# Patient Record
Sex: Male | Born: 1985 | Race: White | Hispanic: No | Marital: Single | Smoking: Never smoker
Health system: Southern US, Community
[De-identification: ages and names within clinical notes are randomized; demographics above are authoritative.]

## PROBLEM LIST (undated history)

## (undated) DIAGNOSIS — J45909 Unspecified asthma, uncomplicated: Secondary | ICD-10-CM

## (undated) DIAGNOSIS — B009 Herpesviral infection, unspecified: Secondary | ICD-10-CM

## (undated) HISTORY — DX: Herpesviral infection, unspecified: B00.9

## (undated) HISTORY — DX: Unspecified asthma, uncomplicated: J45.909

## (undated) HISTORY — PX: APPENDECTOMY: SHX54

---

## 2007-08-06 ENCOUNTER — Emergency Department (HOSPITAL_COMMUNITY): Admission: EM | Admit: 2007-08-06 | Discharge: 2007-08-06 | Payer: Self-pay | Admitting: Emergency Medicine

## 2009-06-08 ENCOUNTER — Emergency Department (HOSPITAL_COMMUNITY): Admission: EM | Admit: 2009-06-08 | Discharge: 2009-06-08 | Payer: Self-pay | Admitting: Emergency Medicine

## 2009-06-20 ENCOUNTER — Emergency Department (HOSPITAL_COMMUNITY): Admission: EM | Admit: 2009-06-20 | Discharge: 2009-06-20 | Payer: Self-pay | Admitting: Emergency Medicine

## 2009-11-04 ENCOUNTER — Emergency Department (HOSPITAL_COMMUNITY): Admission: EM | Admit: 2009-11-04 | Discharge: 2009-11-05 | Payer: Self-pay | Admitting: Emergency Medicine

## 2009-11-07 ENCOUNTER — Emergency Department (HOSPITAL_COMMUNITY): Admission: EM | Admit: 2009-11-07 | Discharge: 2009-11-07 | Payer: Self-pay | Admitting: Emergency Medicine

## 2010-09-27 ENCOUNTER — Emergency Department (HOSPITAL_COMMUNITY): Payer: BC Managed Care – PPO

## 2010-09-27 ENCOUNTER — Emergency Department (HOSPITAL_COMMUNITY)
Admission: EM | Admit: 2010-09-27 | Discharge: 2010-09-27 | Disposition: A | Discharge status: Home / Self Care | Payer: BC Managed Care – PPO | Attending: Emergency Medicine | Admitting: Emergency Medicine

## 2010-09-27 DIAGNOSIS — S0083XA Contusion of other part of head, initial encounter: Secondary | ICD-10-CM | POA: Insufficient documentation

## 2010-09-27 DIAGNOSIS — S0003XA Contusion of scalp, initial encounter: Secondary | ICD-10-CM | POA: Insufficient documentation

## 2010-09-27 DIAGNOSIS — Y92009 Unspecified place in unspecified non-institutional (private) residence as the place of occurrence of the external cause: Secondary | ICD-10-CM | POA: Insufficient documentation

## 2010-09-27 DIAGNOSIS — S60229A Contusion of unspecified hand, initial encounter: Secondary | ICD-10-CM | POA: Insufficient documentation

## 2010-09-27 DIAGNOSIS — H5789 Other specified disorders of eye and adnexa: Secondary | ICD-10-CM | POA: Insufficient documentation

## 2010-09-27 DIAGNOSIS — J0100 Acute maxillary sinusitis, unspecified: Secondary | ICD-10-CM | POA: Insufficient documentation

## 2010-09-27 DIAGNOSIS — J320 Chronic maxillary sinusitis: Secondary | ICD-10-CM | POA: Insufficient documentation

## 2010-09-27 DIAGNOSIS — W19XXXA Unspecified fall, initial encounter: Secondary | ICD-10-CM | POA: Insufficient documentation

## 2011-02-19 LAB — CBC
HCT: 46.5
Hemoglobin: 16.6
MCHC: 35.7
MCV: 91.4
Platelets: 214
RBC: 5.09
RDW: 12.5
WBC: 13.2 — ABNORMAL HIGH

## 2011-02-19 LAB — DIFFERENTIAL
Basophils Absolute: 0
Basophils Relative: 0
Eosinophils Absolute: 0
Eosinophils Relative: 0
Lymphocytes Relative: 2 — ABNORMAL LOW
Lymphs Abs: 0.2 — ABNORMAL LOW
Monocytes Absolute: 0.6
Monocytes Relative: 4
Neutrophils Relative %: 94 — ABNORMAL HIGH

## 2011-02-19 LAB — COMPREHENSIVE METABOLIC PANEL
ALT: 23
Albumin: 4.4
CO2: 24
Calcium: 9.2
Creatinine, Ser: 1.09
GFR calc non Af Amer: >60
Potassium: 4.2
Total Protein: 8

## 2011-02-19 LAB — URINALYSIS, ROUTINE W REFLEX MICROSCOPIC
Protein, ur: NEGATIVE
Specific Gravity, Urine: 1.033 — ABNORMAL HIGH
Urobilinogen, UA: 0.2

## 2011-07-04 ENCOUNTER — Ambulatory Visit (INDEPENDENT_AMBULATORY_CARE_PROVIDER_SITE_OTHER): Payer: BC Managed Care – PPO | Admitting: Physician Assistant

## 2011-07-04 VITALS — BP 130/80 | HR 76 | Temp 98.0°F | Resp 16 | Ht 69.0 in | Wt 168.0 lb

## 2011-07-04 DIAGNOSIS — Z7251 High risk heterosexual behavior: Secondary | ICD-10-CM

## 2011-07-04 DIAGNOSIS — R21 Rash and other nonspecific skin eruption: Secondary | ICD-10-CM

## 2011-07-04 LAB — POCT CBC
Granulocyte percent: 49.2 %G (ref 37–80)
HCT, POC: 43.6 % (ref 43.5–53.7)
Hemoglobin: 14.9 g/dL (ref 14.1–18.1)
Lymph, poc: 2.5 (ref 0.6–3.4)
MCH, POC: 31.6 pg — AB (ref 27–31.2)
MCV: 92.4 fL (ref 80–97)
MID (cbc): 0.6 (ref 0–0.9)
POC Granulocyte: 3 (ref 2–6.9)
POC MID %: 10.4 %M (ref 0–12)
RDW, POC: 12.6 %
WBC: 6.1 10*3/uL (ref 4.6–10.2)

## 2011-07-04 LAB — POCT URINALYSIS DIPSTICK
Bilirubin, UA: NEGATIVE
Blood, UA: NEGATIVE
Nitrite, UA: NEGATIVE
Spec Grav, UA: 1.02

## 2011-07-04 LAB — POCT UA - MICROSCOPIC ONLY
Casts, Ur, LPF, POC: NEGATIVE
Crystals, Ur, HPF, POC: NEGATIVE
Yeast, UA: NEGATIVE

## 2011-07-04 LAB — HIV ANTIBODY (ROUTINE TESTING W REFLEX): HIV: NONREACTIVE

## 2011-07-04 LAB — POCT SKIN KOH: Skin KOH, POC: NEGATIVE

## 2011-07-04 LAB — RPR

## 2011-07-04 LAB — GLUCOSE, POCT (MANUAL RESULT ENTRY): POC Glucose: 112

## 2011-07-04 MED ORDER — VALACYCLOVIR HCL 1 G PO TABS: 1000.0000 mg | ORAL_TABLET | Freq: Two times a day (BID) | ORAL | Status: DC

## 2011-07-04 MED ORDER — CLOTRIMAZOLE-BETAMETHASONE 1-0.05 % EX CREA: TOPICAL_CREAM | Freq: Two times a day (BID) | CUTANEOUS | Status: AC

## 2011-07-04 MED ORDER — DOXYCYCLINE HYCLATE 100 MG PO CAPS: 100.0000 mg | ORAL_CAPSULE | Freq: Two times a day (BID) | ORAL | Status: AC

## 2011-07-04 NOTE — Patient Instructions (Signed)
Safer Sex Your caregiver wants you to have this information about the infections that can be transmitted from sexual contact and how to prevent them. The idea behind safer sex is that you can be sexually active, and at the same time reduce the risk of giving or getting a sexually transmitted disease (STD). Every person should be aware of how to prevent him or herself and his or her sex partner from getting an STD. CAUSES OF STDS STDs are transmitted by sharing body fluids, which contain viruses and bacteria. The following fluids all transmit infections during sexual intercourse and sex acts:  Semen.   Saliva.   Urine.   Blood.   Vaginal mucus.  Examples of STDs include:  Chlamydia.   Gonorrhea.   Genital herpes.   Hepatitis B.   Human immunodeficiency virus or acquired immunodeficiency syndrome (HIV or AIDS).   Syphilis.   Trichomonas.   Pubic lice.   Human papillomavirus (HPV), which may include:   Genital warts.   Cervical dysplasia.   Cervical cancer (can develop with certain types of HPV).  SYMPTOMS  Sexual diseases often cause few or no symptoms until they are advanced, so a person can be infected and spread the infection without knowing it. Some STDs respond to treatment very well. Others, like HIV and herpes, cannot be cured, but are treated to reduce their effects. Specific symptoms include:  Abnormal vaginal discharge.   Irritation or itching in and around the vagina, and in the pubic hair.   Pain during sexual intercourse.   Bleeding during sexual intercourse.   Pelvic or abdominal pain.   Fever.   Growths in and around the vagina.   An ulcer in or around the vagina.   Swollen glands in the groin area.  DIAGNOSIS   Blood tests.   Pap test.   Culture test of abnormal vaginal discharge.   A test that applies a solution and examines the cervix with a lighted magnifying scope (colposcopy).   A test that examines the pelvis with a lighted  tube, through a small incision (laparoscopy).  TREATMENT  The treatment will depend on the cause of the STD.  Antibiotic treatment by injection, oral, creams, or suppositories in the vagina.   Over-the-counter medicated shampoo, to get rid of pubic lice.   Removing or treating growths with medicine, freezing, burning (electrocautery), or surgery.   Surgery treatment for HPV of the cervix.   Supportive medicines for herpes, HIV, AIDS, and hepatitis.  Being careful cannot eliminate all risk of infection, but sex can be made much safer. Safe sexual practices include body massage and gentle touching. Masturbation is safe, as long as body fluids do not contact skin that has sores or cuts. Dry kissing and oral sex on a man wearing a latex condom or on a woman wearing a male condom is also safe. Slightly less safe is intercourse while the man wears a latex condom or wet kissing. It is also safer to have one sex partner that you know is not having sex with anyone else. LENGTH OF ILLNESS An STD might be treated and cured in a week, sometimes a month, or more. And it can linger with symptoms for many years. STDs can also cause damage to the male organs. This can cause chronic pain, infertility, and recurrence of the STD, especially herpes, hepatitis, HIV, and HPV. HOME CARE INSTRUCTIONS AND PREVENTION  Alcohol and recreational drugs are often the reason given for not practicing safer sex. These substances affect   your judgment. Alcohol and recreational drugs can also impair your immune system, making you more vulnerable to disease.   Do not engage in risky and dangerous sexual practices, including:   Vaginal or anal sex without a condom.   Oral sex on a man without a condom.   Oral sex on a woman without a male condom.   Using saliva to lubricate a condom.   Any other sexual contact in which body fluids or blood from one partner contact the other partner.   You should use only latex  condoms for men and water soluble lubricants. Petroleum based lubricants or oils used to lubricate a condom will weaken the condom and increase the chance that it will break.   Think very carefully before having sex with anyone who is high risk for STDs and HIV. This includes IV drug users, people with multiple sexual partners, or people who have had an STD, or a positive hepatitis or HIV blood test.   Remember that even if your partner has had only one previous partner, their previous partner might have had multiple partners. If so, you are at high risk of being exposed to an STD. You and your sex partner should be the only sex partners with each other, with no one else involved.   A vaccine is available for hepatitis B and HPV through your caregiver or the Public Health Department. Everyone should be vaccinated with these vaccines.   Avoid risky sex practices. Sex acts that can break the skin make you more likely to get an STD.  SEEK MEDICAL CARE IF:   If you think you have an STD, even if you do not have any symptoms. Contact your caregiver for evaluation and treatment, if needed.   You think or know your sex partner has acquired an STD.   You have any of the symptoms mentioned above.  Document Released: 06/21/2004 Document Revised: 01/24/2011 Document Reviewed: 04/13/2009 Exit[employer] Patient Information 2012 [employer]. 

## 2011-07-04 NOTE — Progress Notes (Signed)
Subjective:    Patient ID: Jesus Lee, male    DOB: 05/05/1986, 26 y.o.   MRN: [id number]  Primary Physician: No primary provider on file.  Chief Complaint: Rash along groin for 1 week   HPI 26 y.o. y/o Caucasian male with history noted below presents with 1 week history of improving pruritic rash along bilateral inner thighs and penis. Rash began along the right inner thigh, spread to the left thigh, and finally to the penis. Pruritic. No pain or urinary symptoms. Mildly erythematous rash.  Patient mentions having unprotected sex with his ex-girlfriend 3 weeks prior. No other partners. No history of STDs.  4 weeks prior he began taking a multivitamin supplement that he purchased online. Since stopping this vitamin he notes the rash has improved.  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 Patient later states he may have worn dirty under garments.  Has not tried any remedies.  Afebrile. Otherwise doing well.  History reviewed. No pertinent past medical history.  Prior to Admission medications   Medication Sig Start Date End Date Taking? Authorizing Provider  Multiple Vitamin (MULTIVITAMIN) tablet Take 1 tablet by mouth daily.   Yes Historical Provider, MD                         No Known Allergies  History   Social History  . Marital Status: Single    Spouse Name: N/A    Number of Children: N/A  . Years of Education: N/A   Social History Main Topics  . Smoking status: Never Smoker   . Smokeless tobacco: None  . Alcohol Use: None  . Drug Use: None  . Sexually Active: None   Other Topics Concern  . None   Social History Narrative  . None    No family history on file.      Review of Systems  Constitutional: Negative for fever, chills and fatigue.  Gastrointestinal: Negative for nausea, vomiting, abdominal pain and diarrhea.  Genitourinary: Positive for genital sores (see rash comment).  Negative for dysuria, urgency, frequency, hematuria, flank pain, decreased urine volume, discharge, penile swelling, scrotal swelling, enuresis, difficulty urinating, penile pain and testicular pain.  Skin: Positive for rash (Bilateral inner thighs and penis). Negative for wound.       Objective:   Physical Exam  Constitutional: He is oriented to person, place, and time. He appears well-developed and well-nourished. No distress.  HENT:  Head: Normocephalic and atraumatic.  Right Ear: External ear normal.  Left Ear: External ear normal.  Eyes: Conjunctivae and EOM are normal. Pupils are equal, round, and reactive to light. Right eye exhibits no discharge. Left eye exhibits no discharge. No scleral icterus.  Neck: Normal range of motion. Neck supple. No tracheal deviation present. No thyromegaly present.  Cardiovascular: Normal rate, regular rhythm and normal heart sounds.  Exam reveals no gallop and no friction rub.   No murmur heard. Pulmonary/Chest: Effort normal and breath sounds normal. No respiratory distress. He has no wheezes. He has no rales.  Genitourinary: Testes normal.    Cremasteric reflex is present. Circumcised. Penile erythema (see comment ) present. No penile tenderness. No discharge found.  Lymphadenopathy:    He has no cervical adenopathy.       Right: No inguinal adenopathy present.       Left: No inguinal adenopathy present.  Neurological: He is alert and oriented to person, place, and  time.  Skin: Skin is warm and dry. Rash noted. Rash is vesicular (Isolated vesicular lesion along corona without secondary infection). Rash is not urticarial. He is not diaphoretic.     Psychiatric: He has a normal mood and affect. His behavior is normal. Judgment and thought content normal.   Labs: Results for orders placed in visit on 07/04/11  POCT CBC      Component Value Range   WBC 6.1  4.6 - 10.2 (K/uL)   Lymph, poc 2.5  0.6 - 3.4    POC LYMPH PERCENT 40.4  10 - 50 (%L)     MID (cbc) 0.6  0 - 0.9    POC MID % 10.4  0 - 12 (%M)   POC Granulocyte 3.0  2 - 6.9    Granulocyte percent 49.2  37 - 80 (%G)   RBC 4.72  4.69 - 6.13 (M/uL)   Hemoglobin 14.9  14.1 - 18.1 (g/dL)   HCT, POC 56.2  13.0 - 53.7 (%)   MCV 92.4  80 - 97 (fL)   MCH, POC 31.6 (*) 27 - 31.2 (pg)   MCHC 34.2  31.8 - 35.4 (g/dL)   RDW, POC 86.5     Platelet Count, POC 257  142 - 424 (K/uL)   MPV 9.5  0 - 99.8 (fL)  GLUCOSE, POCT (MANUAL RESULT ENTRY)      Component Value Range   POC Glucose 112    POCT URINALYSIS DIPSTICK      Component Value Range   Color, UA yellow     Clarity, UA cloudy     Glucose, UA negative     Bilirubin, UA negative     Ketones, UA negative     Spec Grav, UA 1.020     Blood, UA negative     pH, UA 7.0     Protein, UA negative     Urobilinogen, UA 0.2     Nitrite, UA negative     Leukocytes, UA Negative    POCT UA - MICROSCOPIC ONLY      Component Value Range   WBC, Ur, HPF, POC 0-2     RBC, urine, microscopic negative     Bacteria, U Microscopic small     Mucus, UA negative     Epithelial cells, urine per micros 1-3     Crystals, Ur, HPF, POC negative     Casts, Ur, LPF, POC negative     Yeast, UA negative    POCT SKIN KOH      Component Value Range   Skin KOH, POC Negative     HIV, RPR, HSV 1-2, Uriprobe all pending.       Assessment & Plan:  26 y.o. Caucasian male with pruritic vesicular rash along bilateral thighs and penis. Consider tinea cruris vs HSV vs contact/allergic reaction. 1.  Outpatient Encounter Prescriptions as of 07/04/2011  Medication Sig Dispense Refill        . clotrimazole-betamethasone (LOTRISONE) cream Apply topically 2 (two) times daily.  30 g  0  . doxycycline (VIBRAMYCIN) 100 MG capsule Take 1 capsule (100 mg total) by mouth 2 (two) times daily.  20 capsule  0  . valACYclovir (VALTREX) 1000 MG tablet Take 1 tablet (1,000 mg total) by mouth 2 (two) times daily.  14 tablet  0  2. Stop vitamin 3. Avoid excessive  perspiration 4. Can add benadryl and Zyrtec if needed for pruritis 5. Await labs 6. Avoid intercourse until etiology determined. Advised safe  sex practices. 7. RTC precautions  Signed,  Eula Listen, PA-C 07/04/2011, 11:22 AM

## 2011-07-05 LAB — HSV(HERPES SIMPLEX VRS) I + II AB-IGG
HSV 1 Glycoprotein G Ab, IgG: 1.65 IV — ABNORMAL HIGH
HSV 2 Glycoprotein G Ab, IgG: 0.12 IV

## 2011-07-06 ENCOUNTER — Telehealth: Payer: Self-pay

## 2011-07-06 NOTE — Telephone Encounter (Signed)
LAB RESULTS FROM RECENT VISIT WITH DR. Neva Seat

## 2011-07-07 NOTE — Telephone Encounter (Signed)
Spoke with patient, he already received results from his labs.

## 2011-07-18 ENCOUNTER — Telehealth: Payer: Self-pay

## 2011-07-18 ENCOUNTER — Ambulatory Visit (INDEPENDENT_AMBULATORY_CARE_PROVIDER_SITE_OTHER): Payer: BC Managed Care – PPO | Admitting: Family Medicine

## 2011-07-18 DIAGNOSIS — R21 Rash and other nonspecific skin eruption: Secondary | ICD-10-CM

## 2011-07-18 DIAGNOSIS — B009 Herpesviral infection, unspecified: Secondary | ICD-10-CM

## 2011-07-18 DIAGNOSIS — L738 Other specified follicular disorders: Secondary | ICD-10-CM

## 2011-07-18 DIAGNOSIS — L299 Pruritus, unspecified: Secondary | ICD-10-CM

## 2011-07-18 LAB — POCT CBC
Hemoglobin: 16.2 g/dL (ref 14.1–18.1)
MCHC: 33.5 g/dL (ref 31.8–35.4)
MCV: 93 fL (ref 80–97)
MID (cbc): 0.7 (ref 0–0.9)
POC Granulocyte: 2.9 (ref 2–6.9)
POC MID %: 12.5 %M — AB (ref 0–12)
RDW, POC: 13.1 %

## 2011-07-18 LAB — POCT SEDIMENTATION RATE: POCT SED RATE: 2 mm/hr (ref 0–22)

## 2011-07-18 LAB — POCT SKIN KOH: Skin KOH, POC: NEGATIVE

## 2011-07-18 MED ORDER — TRIAMCINOLONE ACETONIDE 0.1 % EX CREA: TOPICAL_CREAM | CUTANEOUS | Status: AC

## 2011-07-18 NOTE — Telephone Encounter (Signed)
Spoke with patient and gave him names of creams. He understood and i told him to cb with anymore ?'s.

## 2011-07-18 NOTE — Telephone Encounter (Signed)
Pt seen in office today he would like for PA Dunn or nurse to contact him, he is not clear on instructions on his diagnosis and has some other concerns

## 2011-07-18 NOTE — Progress Notes (Signed)
Patient ID: Jesus Lee MRN: [id number], DOB: 09/30/1985, 26 y.o. Date of Encounter: 07/18/2011, 12:24 PM  Primary Physician: No primary provider on file.  Chief Complaint: Follow up rash  HPI: 26 y.o. year old male with history below presents for follow up pruritic rash along bilateral groin, penis, and now along bilateral abdomen. Rash is pruritic along abdomen and groin. Not pruritic along the penis. Afebrile. Skin is flaking/dry. He also mentions mildly erythematous pink tone of skin through out. No pain, blisters, or wounds. Has taken all of the Valtrex, has recently restarted the Doxycycline, and has used the Lotrisone off and on. Has taken Zyrtec and Benadryl to help with the pruritis, but this returns as the medication wanes. He denies any GI or GU symptoms. He continues to take 5 different types of daily vitamins, some of which are bought online. Lives with a roommate who to the patient's knowledge is asymptomatic. He does live with 2 dogs as well. No new soaps or detergents.  He is currently waiting to hear from his ex-girlfriend about her STD work up. No intercourse since last OV.   Past Medical History  Diagnosis Date  . HSV-1 (herpes simplex virus 1) infection      Home Meds: Prior to Admission medications   Medication Sig Start Date End Date Taking? Authorizing Provider  cetirizine (ZYRTEC) 10 MG chewable tablet Chew 10 mg by mouth daily.   Yes Historical Provider, MD  clotrimazole-betamethasone (LOTRISONE) cream Apply topically 2 (two) times daily. 07/04/11 07/03/12 Yes Jaeshawn Silvio, PA-C  doxycycline (DORYX) 100 MG DR capsule Take 100 mg by mouth 2 (two) times daily.   Yes Historical Provider, MD  Multiple Vitamin (MULTIVITAMIN) tablet Take 1 tablet by mouth daily.   Yes Historical Provider, MD  valACYclovir (VALTREX) 1000 MG tablet Take 1 tablet (1,000 mg total) by mouth 2 (two) times daily. 07/04/11 07/03/12 Yes Meshach Perry, PA-C    Allergies: No Known Allergies  History     Social History  . Marital Status: Single    Spouse Name: N/A    Number of Children: N/A  . Years of Education: N/A   Occupational History  . Not on file.   Social History Main Topics  . Smoking status: Never Smoker   . Smokeless tobacco: Not on file  . Alcohol Use: Not on file  . Drug Use: Not on file  . Sexually Active: Not on file   Other Topics Concern  . Not on file   Social History Narrative  . No narrative on file     Review of Systems: Constitutional: negative for chills, fever, night sweats, weight changes, or fatigue  HEENT: negative for vision changes, hearing loss, congestion, rhinorrhea, ST, epistaxis, or sinus pressure Cardiovascular: negative for chest pain or palpitations Respiratory: negative for hemoptysis, wheezing, shortness of breath, or cough Abdominal: negative for abdominal pain, nausea, vomiting, diarrhea, or constipation Genitourinary: negative for dysuria, frequency, urgency, hematuria, discharge, impotence, testicular pain/mass, or nocturia Dermatological: negative for wounds, or lumps Neurologic: negative for headache, dizziness, or syncope All other systems reviewed and are otherwise negative with the exception to those above and in the HPI.   Physical Exam: Blood pressure 130/86, pulse 60, temperature 98.1 F (36.7 C), temperature source Oral, resp. rate 18, height 5\' 9"  (1.753 m), weight 170 lb (77.111 kg)., Body mass index is 25.10 kg/(m^2). General: Well developed, well nourished, in no acute distress. Head: Normocephalic, atraumatic, eyes without discharge, sclera non-icteric, nares are without discharge. Bilateral  auditory canals clear, TM's are without perforation, pearly grey and translucent with reflective cone of light bilaterally. Oral cavity moist, posterior pharynx without exudate, erythema, peritonsillar abscess, or post nasal drip.  Neck: Supple. No thyromegaly. Full ROM. No lymphadenopathy. Lungs: Clear bilaterally to  auscultation without wheezes, rales, or rhonchi. Breathing is unlabored. Heart: RRR with S1 S2. No murmurs, rubs, or gallops appreciated. Abdomen: Soft, non-tender, non-distended with normoactive bowel sounds. No hepatomegaly. No rebound/guarding. No obvious abdominal masses. Msk:  Strength and tone normal for age. Extremities/Skin: Warm and very dry. No clubbing or cyanosis. No edema. Recently shaved tissue along chest, abdomen, and groin. Diffuse mildly erythematous pink non-raised rash through out anterior chest and abdomen with an increase in erythema along LLQ and RLQ with some scaling locally in these areas, thought to be from dry skin rather than infection. Several pinpoint erythematous lesions along bilateral inner thighs left being worse than right. Resolving erythematous lesion along penile shaft. No secondary infections. No linear tracking or excoriations.  Neuro: Alert and oriented X 3. Moves all extremities spontaneously. Gait is normal. CNII-XII grossly in tact. Psych:  Responds to questions appropriately with a normal affect.   Labs: Results for orders placed in visit on 07/18/11  POCT CBC      Component Value Range   WBC 5.8  4.6 - 10.2 (K/uL)   Lymph, poc 2.2  0.6 - 3.4    POC LYMPH PERCENT 37.9  10 - 50 (%L)   MID (cbc) 0.7  0 - 0.9    POC MID % 12.5 (*) 0 - 12 (%M)   POC Granulocyte 2.9  2 - 6.9    Granulocyte percent 49.6  37 - 80 (%G)   RBC 5.20  4.69 - 6.13 (M/uL)   Hemoglobin 16.2  14.1 - 18.1 (g/dL)   HCT, POC 16.1  09.6 - 53.7 (%)   MCV 93.0  80 - 97 (fL)   MCH, POC 31.2  27 - 31.2 (pg)   MCHC 33.5  31.8 - 35.4 (g/dL)   RDW, POC 04.5     Platelet Count, POC 276  142 - 424 (K/uL)   MPV 8.9  0 - 99.8 (fL)  POCT SEDIMENTATION RATE      Component Value Range   POCT SED RATE    0 - 22 (mm/hr)  POCT SKIN KOH      Component Value Range   Skin KOH, POC Negative     CMP/TSH/ESR all pending.  ASSESSMENT AND PLAN:  26 y.o. year old male with likely folliculitis  along inner thighs, and irritation from dry skin/shaving along anterior chest and abdomen. -Triamcinolone topical cream 0.1% Apply to affected area bid #45 grams 2 RF -Eucerin cream bid as his skin is quite dry -Stop all OTC supplements -Hold on shaving the body -Finish Doxycycline that was previously prescribed -Await labs, follow up pending -RTC precautions -Seen and discussed with Dr. Neva Seat  Signed, Eula Listen, PA-C 07/18/2011 12:24 PM

## 2011-07-18 NOTE — Telephone Encounter (Signed)
Pt returning phone call; please call back

## 2011-07-18 NOTE — Patient Instructions (Signed)
Folliculitis      Folliculitis is an infection and inflammation of the hair follicles. Hair follicles become red and irritated. This inflammation is usually caused by bacteria. The bacteria thrive in warm, moist environments. This condition can be seen anywhere on the body.   CAUSES  The most common cause of folliculitis is an infection by germs (bacteria). Fungal and viral infections can also cause the condition. Viral infections may be more common in people whose bodies are unable to fight disease well (weakened immune systems). Examples include people with:  · AIDS.   · An organ transplant.   · Cancer.   People with depressed immune systems, diabetes, or obesity, have a greater risk of getting folliculitis than the general population. Certain chemicals, especially oils and tars, also can cause folliculitis.  SYMPTOMS  · An early sign of folliculitis is a small, white or yellow pus-filled, itchy lesion (pustule). These lesions appear on a red, inflamed follicle. They are usually less than 5 mm (.20 inches).   · The most likely starting points are the scalp, thighs, legs, back and buttocks. Folliculitis is also frequently found in areas of repeated shaving.   · When an infection of the follicle goes deeper, it becomes a boil or furuncle. A group of closely packed boils create a larger lesion (a carbuncle). These sores (lesions) tend to occur in hairy, sweaty areas of the body.   TREATMENT   · A doctor who specializes in skin problems (dermatologists) treats mild cases of folliculitis with antiseptic washes.   · They also use a skin application which kills germs (topical antibiotics). Tea tree oil is a good topical antiseptic as well. It can be found at a health food store. A small percentage of individuals may develop an allergy to the tea tree oil.   · Mild to moderate boils respond well to warm water compresses applied three times daily.   · In some cases, oral antibiotics should be taken with the skin treatment.    · If lesions contain large quantities of pus or fluid, your caregiver may drain them. This allows the topical antibiotics to get to the affected areas better.   · Stubborn cases of folliculitis may respond to laser hair removal. This process uses a high intensity light beam (a laser) to destroy the follicle and reduces the scarring from folliculitis. After laser hair removal, hair will no longer grow in the laser treated area.   Patients with long-lasting folliculitis need to find out where the infection is coming from. Germs can live in the nostrils of the patient. This can trigger an outbreak now and then. Sometimes the bacteria live in the nostrils of a family member. This person does not develop the disorder but they repeatedly re-expose others to the germ. To break the cycle of recurrence in the patient, the family member must also undergo treatment.  PREVENTION   · Individuals who are predisposed to folliculitis should be extremely careful about personal hygiene.   · Application of antiseptic washes may help prevent recurrences.   · A topical antibiotic cream, mupirocin (Bactroban®), has been effective at reducing bacteria in the nostrils. It is applied inside the nose with your little finger. This is done twice daily for a week. Then it is repeated every 6 months.   · Because follicle disorders tend to come back, patients must receive follow-up care. Your caregiver may be able to recognize a recurrence before it becomes severe.   SEEK IMMEDIATE MEDICAL CARE   IF:   · You develop redness, swelling, or increasing pain in the area.   · You have a fever.   · You are not improving with treatment or are getting worse.   · You have any other questions or concerns.   Document Released: 07/23/2001 Document Revised: 01/24/2011 Document Reviewed: 05/19/2008  [employer]® Patient Information ©2012 [employer].

## 2011-07-18 NOTE — Telephone Encounter (Signed)
LMOM to CB. 

## 2011-07-19 LAB — COMPREHENSIVE METABOLIC PANEL
ALT: 19 U/L (ref 0–53)
AST: 19 U/L (ref 0–37)
Albumin: 5 g/dL (ref 3.5–5.2)
BUN: 14 mg/dL (ref 6–23)
CO2: 28 mEq/L (ref 19–32)
Calcium: 9.8 mg/dL (ref 8.4–10.5)
Chloride: 102 mEq/L (ref 96–112)
Creat: 1 mg/dL (ref 0.50–1.35)
Glucose, Bld: 82 mg/dL (ref 70–99)
Total Bilirubin: 0.7 mg/dL (ref 0.3–1.2)

## 2011-07-19 LAB — TSH: TSH: 1.553 u[IU]/mL (ref 0.350–4.500)

## 2011-07-25 NOTE — Progress Notes (Signed)
  Subjective:    Patient ID: Jesus Lee, male    DOB: 1986-02-25, 26 y.o.   MRN: [id number]  HPI    Review of Systems     Objective:   Physical Exam        Assessment & Plan:  Pt seen and examined with Eula Listen, PA-C on 07/18/11. Agree with exam and plan.

## 2011-07-30 ENCOUNTER — Ambulatory Visit (INDEPENDENT_AMBULATORY_CARE_PROVIDER_SITE_OTHER): Payer: BC Managed Care – PPO | Admitting: Physician Assistant

## 2011-07-30 VITALS — BP 132/75 | HR 53 | Temp 97.7°F | Resp 16 | Ht 69.0 in | Wt 168.0 lb

## 2011-07-30 DIAGNOSIS — S0180XA Unspecified open wound of other part of head, initial encounter: Secondary | ICD-10-CM

## 2011-07-30 DIAGNOSIS — A6001 Herpesviral infection of penis: Secondary | ICD-10-CM

## 2011-07-30 DIAGNOSIS — B009 Herpesviral infection, unspecified: Secondary | ICD-10-CM

## 2011-07-30 DIAGNOSIS — Z23 Encounter for immunization: Secondary | ICD-10-CM

## 2011-07-30 DIAGNOSIS — R21 Rash and other nonspecific skin eruption: Secondary | ICD-10-CM

## 2011-07-30 MED ORDER — VALACYCLOVIR HCL 1 G PO TABS: 1000.0000 mg | ORAL_TABLET | Freq: Every day | ORAL | Status: AC

## 2011-07-30 NOTE — Progress Notes (Signed)
Patient ID: Jesus Lee MRN: [id number], DOB: 1986-01-03, 26 y.o. Date of Encounter: 07/30/2011, 10:53 AM  Primary Physician: No primary provider on file.  Chief Complaint: Follow up rash, resolved  HPI: 26 y.o. year old male with history below presents for follow up of folliculitis/skin irritation rash. Seen initially on 2/6 and again on 2/20. Rash along abdomen and inner thighs has fully resolved. Patient states he changed his old bed sheets after his last visit and got new ones. This caused his rash along his abdomen and inner thighs to improve and ultimately resolve. No further pruritis. He has continued to shave his body hair. Using a new razor currently.  The isolated penile lesion has mostly resolved. No dysuria, urinary frequency, or urgency. No discharge. No new partners. 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  He has remained afebrile. No myalgias or arthralgias.  He also states several days ago he leaned over and hit his forehead along a piece of metal at work. It bled a small amount. No pain, swelling, erythema, or tenderness. He requests a tetanus shot today, unsure of when his last one was. No headache or LOC.   Past Medical History  Diagnosis Date  . HSV-1 (herpes simplex virus 1) infection      Home Meds: Prior to Admission medications   Medication Sig Start Date End Date Taking? Authorizing Provider  cetirizine (ZYRTEC) 10 MG chewable tablet Chew 10 mg by mouth daily.   Yes Historical Provider, MD  clotrimazole-betamethasone (LOTRISONE) cream Apply topically 2 (two) times daily. 07/04/11 07/03/12 Yes Rehana Uncapher, PA-C  doxycycline (DORYX) 100 MG DR capsule Take 100 mg by mouth 2 (two) times daily.   Yes Historical Provider, MD  Multiple Vitamin (MULTIVITAMIN) tablet Take 1 tablet by mouth daily.   Yes Historical Provider, MD  triamcinolone cream (KENALOG) 0.1 % APPLY TO AFFECTED AREA BID 07/18/11 07/17/12 Yes Nuha Degner, PA-C  valACYclovir  (VALTREX) 1000 MG tablet Take 1 tablet (1,000 mg total) by mouth 2 (two) times daily. 07/04/11 07/03/12 Yes Mariangel Ringley, PA-C    Allergies: No Known Allergies  History   Social History  . Marital Status: Single    Spouse Name: N/A    Number of Children: N/A  . Years of Education: N/A   Occupational History  . Not on file.   Social History Main Topics  . Smoking status: Never Smoker   . Smokeless tobacco: Not on file  . Alcohol Use: Not on file  . Drug Use: Not on file  . Sexually Active: Not on file   Other Topics Concern  . Not on file   Social History Narrative  . No narrative on file     Review of Systems: Constitutional: negative for chills, fever, night sweats, weight changes, or fatigue  HEENT: negative for vision changes, hearing loss, congestion, rhinorrhea, ST, epistaxis, or sinus pressure Cardiovascular: negative for chest pain or palpitations Respiratory: negative for hemoptysis, wheezing, shortness of breath, or cough Abdominal: negative for abdominal pain, nausea, vomiting, diarrhea, or constipation Dermatological: negative for rash, or erythema Neurologic: negative for headache, dizziness, or syncope All other systems reviewed and are otherwise negative with the exception to those above and in the HPI.   Physical Exam: Blood pressure 132/75, pulse 53, temperature 97.7 F (36.5 C), temperature source Oral, resp. rate 16, height 5\' 9"  (1.753 m), weight 168 lb (76.204 kg)., Body mass index is 24.81 kg/(m^2). General: Well developed, well nourished, in no acute  distress. Head: Normocephalic, atraumatic, eyes without discharge, sclera non-icteric, nares are without discharge. Bilateral auditory canals clear, TM's are without perforation, pearly grey and translucent with reflective cone of light bilaterally. Oral cavity moist, posterior pharynx without exudate, erythema, peritonsillar abscess, or post nasal drip.  Neck: Supple. No thyromegaly. Full ROM. No  lymphadenopathy. Lungs: Clear bilaterally to auscultation without wheezes, rales, or rhonchi. Breathing is unlabored. Heart: RRR with S1 S2. No murmurs, rubs, or gallops appreciated. Abdomen: Soft, non-tender, non-distended with normoactive bowel sounds. No hepatomegaly. No rebound/guarding. No obvious abdominal masses.  Genitourinary: Improved isolated vesicle along head of the penis. No secondary infection. No discharge expressed.  Msk:  Strength and tone normal for age. Extremities/Skin: Warm and dry. No clubbing or cyanosis. No edema. No rashes, erythema, dry skin, scaling, or suspicious lesions. Neuro: Alert and oriented X 3. Moves all extremities spontaneously. Gait is normal. CNII-XII grossly in tact. Psych:  Responds to questions appropriately with a normal affect.     ASSESSMENT AND PLAN:  26 y.o. year old male with resolved folliculitis/skin irritation and healing superficial wound along forehead 1. Folliculitis/skin irritation -Resolved -Continue to use a clean razor if plans to shave -Wash bed clothes regularly  2. Wound forehead -Resolving -TDaP today -No secondary infection  3. Schedule a CPE  Signed, Eula Listen, PA-C 07/30/2011 10:53 AM

## 2011-08-30 ENCOUNTER — Encounter: Payer: Self-pay | Admitting: Internal Medicine

## 2011-08-30 ENCOUNTER — Ambulatory Visit: Payer: BC Managed Care – PPO

## 2011-08-30 ENCOUNTER — Ambulatory Visit (INDEPENDENT_AMBULATORY_CARE_PROVIDER_SITE_OTHER): Payer: BC Managed Care – PPO | Admitting: Family Medicine

## 2011-08-30 VITALS — BP 132/88 | HR 60 | Temp 98.0°F | Resp 16 | Ht 69.0 in | Wt 171.6 lb

## 2011-08-30 DIAGNOSIS — M25519 Pain in unspecified shoulder: Secondary | ICD-10-CM

## 2011-08-30 DIAGNOSIS — IMO0002 Reserved for concepts with insufficient information to code with codable children: Secondary | ICD-10-CM

## 2011-08-30 DIAGNOSIS — M25512 Pain in left shoulder: Secondary | ICD-10-CM

## 2011-08-30 DIAGNOSIS — S43409A Unspecified sprain of unspecified shoulder joint, initial encounter: Secondary | ICD-10-CM

## 2011-08-30 MED ORDER — MELOXICAM 7.5 MG PO TABS: 7.5000 mg | ORAL_TABLET | Freq: Every day | ORAL | Status: DC

## 2011-08-30 NOTE — Progress Notes (Signed)
  Subjective:    Patient ID: Jesus Lee, male    DOB: 1985/12/21, 26 y.o.   MRN: [id number]  Shoulder Pain  The pain is present in the left shoulder. This is a new problem. The current episode started in the past 7 days. There has been a history of trauma. The problem has been unchanged. The quality of the pain is described as aching. Associated symptoms include a limited range of motion. The symptoms are aggravated by activity. He has tried NSAIDS for the symptoms. The treatment provided mild relief.  Jesus Lee is a 26 year old WM who was playing basketball 4 days ago when he felt some pain and discomfort in his left shoulder.  He was trying to steal a ball and his arm was outstretched, he did not fall but he did leave the game after his injury.  He tells me he also injured it 4 years ago playing basketball but was never evaluated.  He has taken ibuprophen with some relief but he works at [employer] and had trouble lifting a 40 pound box today and dropped it.  He denies any numbness or tingling in his arm.    Review of Systems  All other systems reviewed and are negative.       Objective:   Physical Exam  Vitals reviewed. Constitutional: He appears well-nourished.  Cardiovascular: Normal rate, regular rhythm and normal heart sounds.   Pulmonary/Chest: Effort normal and breath sounds normal.  Musculoskeletal: He exhibits tenderness.       Left shoulder with tenderness over his anterior deltoid and over his humeral epicondyle laterally.  He has no tenderness over his scapula or his cervical spine.  FROM of cervical spine.  Positive pain with empty can test.  Pain with extremes ROM overhead.   Jesus Lee reading (PRIMARY) by  Dr. Georgiana Shore.  Negative for fracture or dislocation of left shoulder.         Assessment & Plan:  Left shoulder sprain.  Mobic 7. 5 mg BID for 1-2 weeks.  Ice shoulder every night, he should avoid lifting 40 pound boxes for at least a week to allow his shoulder to rest.  If  not improving will refer to orthopedics, pt agrees.  AVS printed and given.

## 2011-08-30 NOTE — Progress Notes (Signed)
  Subjective:    Patient ID: Jesus Lee, male    DOB: 04-24-1986, 26 y.o.   MRN: [id number]  HPI    Review of Systems     Objective:   Physical Exam        Assessment & Plan:  Case reviewed with PA-C.  Agree with plan.

## 2011-08-30 NOTE — Patient Instructions (Signed)
Shoulder Sprain       A shoulder sprain is the result of damage to the tough, fiber-like tissues (ligaments) that help hold your shoulder in place. The ligaments may be stretched or torn. Besides the main shoulder joint (the ball and socket), there are several smaller joints that connect the bones in this area. A sprain usually involves one of those joints. Most often it is the acromioclavicular (or AC) joint. That is the joint that connects the collarbone (clavicle) and the shoulder blade (scapula) at the top point of the shoulder blade (acromion).   A shoulder sprain is a mild form of what is called a shoulder separation. Recovering from a shoulder sprain may take some time. For some, pain lingers for several months. Most people recover without long term problems.   CAUSES   A shoulder sprain is usually caused by some kind of trauma. This might be:   Falling on an outstretched arm.   Being hit hard on the shoulder.   Twisting the arm.   Shoulder sprains are more likely to occur in people who:   Play sports.   Have balance or coordination problems.  SYMPTOMS   Pain when you move your shoulder.   Limited ability to move the shoulder.   Swelling and tenderness on top of the shoulder.   Redness or warmth in the shoulder.   Bruising.   A change in the shape of the shoulder.  DIAGNOSIS   Your healthcare provider may:   Ask about your symptoms.   Ask about recent activity that might have caused those symptoms.   Examine your shoulder. You may be asked to do simple exercises to test movement. The other shoulder will be examined for comparison.   Order some tests that provide a look inside the body. They can show the extent of the injury. The tests could include:   X-rays.   CT (computed tomography) scan.   MRI (magnetic resonance imaging) scan.  RISKS AND COMPLICATIONS   Loss of full shoulder motion.   Ongoing shoulder pain.  TREATMENT   How long it takes to recover from a shoulder sprain depends on how severe it was.  Treatment options may include:   Rest. You should not use the arm or shoulder until it heals.   Ice. For 2 or 3 days after the injury, put an ice pack on the shoulder up to 4 times a day. It should stay on for 15 to 20 minutes each time. Wrap the ice in a towel so it does not touch your skin.   Over-the-counter medicine to relieve pain.   A sling or brace. This will keep the arm still while the shoulder is healing.   Physical therapy or rehabilitation exercises. These will help you regain strength and motion. Ask your healthcare provider when it is OK to begin these exercises.   Surgery. The need for surgery is rare with a sprained shoulder, but some people may need surgery to keep the joint in place and reduce pain.  HOME CARE INSTRUCTIONS   Ask your healthcare provider about what you should and should not do while your shoulder heals.   Make sure you know how to apply ice to the correct area of your shoulder.   Talk with your healthcare provider about which medications should be used for pain and swelling.   If rehabilitation therapy will be needed, ask your healthcare provider to refer you to a therapist. If it is not recommended, then ask   about at-home exercises. Find out when exercise should begin.  SEEK MEDICAL CARE IF:   Your pain, swelling, or redness at the joint increases.   SEEK IMMEDIATE MEDICAL CARE IF:   You have a fever.   You cannot move your arm or shoulder.  Document Released: 09/30/2008 Document Revised: 05/03/2011 Document Reviewed: 09/30/2008   [employer] Patient Information 2012 [employer].

## 2011-09-17 ENCOUNTER — Encounter: Payer: Self-pay | Admitting: Physician Assistant

## 2011-09-17 ENCOUNTER — Ambulatory Visit (INDEPENDENT_AMBULATORY_CARE_PROVIDER_SITE_OTHER): Payer: BC Managed Care – PPO | Admitting: Physician Assistant

## 2011-09-17 VITALS — BP 150/77 | HR 70 | Temp 98.3°F | Resp 18 | Ht 69.0 in | Wt 169.0 lb

## 2011-09-17 DIAGNOSIS — A6001 Herpesviral infection of penis: Secondary | ICD-10-CM

## 2011-09-17 DIAGNOSIS — L255 Unspecified contact dermatitis due to plants, except food: Secondary | ICD-10-CM

## 2011-09-17 DIAGNOSIS — B009 Herpesviral infection, unspecified: Secondary | ICD-10-CM

## 2011-09-17 DIAGNOSIS — L237 Allergic contact dermatitis due to plants, except food: Secondary | ICD-10-CM

## 2011-09-17 DIAGNOSIS — L299 Pruritus, unspecified: Secondary | ICD-10-CM

## 2011-09-17 DIAGNOSIS — R21 Rash and other nonspecific skin eruption: Secondary | ICD-10-CM

## 2011-09-17 MED ORDER — PREDNISONE 20 MG PO TABS: ORAL_TABLET | ORAL | Status: AC

## 2011-09-17 MED ORDER — VALACYCLOVIR HCL 500 MG PO TABS: 500.0000 mg | ORAL_TABLET | Freq: Two times a day (BID) | ORAL | Status: AC

## 2011-09-17 NOTE — Progress Notes (Signed)
Patient ID: Jesus Lee MRN: [id number], DOB: Oct 06, 1985, 26 y.o. Date of Encounter: 09/17/2011, 5:49 PM  Primary Physician: No primary provider on file.  Chief Complaint: HSV flare up  HPI: 26 y.o. year old male with history below presents with a couple issues. 1) 2-3 days history of HSV flare up along the genitals. Has been taking his suppressive dose of Valtrex daily. 2-3 days prior he noticed a return of his vesicular rash along his penis. No discharge or dysuria. Afebrile.  2) 1 week history of pruritic rash along the top of his right foot. This comes on after he spent some time outside bare foot. Rash is vesicular and previously weeping. Afebrile. No spreading. Slowly beginning to improve.  3) He is also concerned today due to the recent case of measles in the area. He was in the clinic one of the days the source patient was here at [facility]. He has remained afebrile and without URI symptoms. He did receive all of his childhood vaccinations. He would just like to check and verify that he is indeed immune to measles.    Past Medical History  Diagnosis Date  . HSV-1 (herpes simplex virus 1) infection      Home Meds: Prior to Admission medications   Medication Sig Start Date End Date Taking? Authorizing Provider  clotrimazole-betamethasone (LOTRISONE) cream Apply topically 2 (two) times daily. 07/04/11 07/03/12 Yes Annsley Akkerman M Donshay Lupinski, PA-C  Multiple Vitamin (MULTIVITAMIN) tablet Take 1 tablet by mouth daily.   Yes Historical Provider, MD  omega-3 acid ethyl esters (LOVAZA) 1 G capsule Take 2 g by mouth 2 (two) times daily.   Yes Historical Provider, MD  triamcinolone cream (KENALOG) 0.1 % APPLY TO AFFECTED AREA BID 07/18/11 07/17/12 Yes Nayshawn Mesta M Serai Tukes, PA-C  valACYclovir (VALTREX) 1000 MG tablet Take 1 tablet (1,000 mg total) by mouth daily. 07/30/11 07/29/12 Yes Waverly Chavarria M Chima Astorino, PA-C  vitamin B-12 (CYANOCOBALAMIN) 500 MCG tablet Take 500 mcg by mouth daily.   Yes Historical Provider, MD  cetirizine  (ZYRTEC) 10 MG chewable tablet Chew 10 mg by mouth daily.    Historical Provider, MD                  Allergies: No Known Allergies  History   Social History  . Marital Status: Single    Spouse Name: N/A    Number of Children: N/A  . Years of Education: N/A   Occupational History  . Not on file.   Social History Main Topics  . Smoking status: Never Smoker   . Smokeless tobacco: Not on file  . Alcohol Use: Not on file  . Drug Use: Not on file  . Sexually Active: Not on file   Other Topics Concern  . Not on file   Social History Narrative  . No narrative on file     Review of Systems: Constitutional: negative for chills, fever, night sweats, weight changes, or fatigue  HEENT: negative for vision changes, hearing loss, congestion, rhinorrhea, ST, epistaxis, or sinus pressure Cardiovascular: negative for chest pain or palpitations Respiratory: negative for hemoptysis, wheezing, shortness of breath, or cough Abdominal: negative for abdominal pain, nausea, vomiting, diarrhea, or constipation Dermatological: negative for wound or lesion Neurologic: negative for headache, dizziness, or syncope All other systems reviewed and are otherwise negative with the exception to those above and in the HPI.   Physical Exam: Blood pressure 150/77, pulse 70, temperature 98.3 F (36.8 C), temperature source Oral, resp. rate 18, height 5'  9" (1.753 m), weight 169 lb (76.658 kg)., Body mass index is 24.96 kg/(m^2). well appearing General: Well developed, well nourished, in no acute distress. Head: Normocephalic, atraumatic, eyes without discharge, sclera non-icteric, nares are without discharge. Bilateral auditory canals clear, TM's are without perforation, pearly grey and translucent with reflective cone of light bilaterally. Oral cavity moist, posterior pharynx without exudate, erythema, peritonsillar abscess, or post nasal drip.  Neck: Supple. No thyromegaly. Full ROM. No  lymphadenopathy. Lungs: Clear bilaterally to auscultation without wheezes, rales, or rhonchi. Breathing is unlabored. Heart: RRR with S1 S2. No murmurs, rubs, or gallops appreciated. Abdomen: Soft, non-tender, non-distended with normoactive bowel sounds. No hepatomegaly. No rebound/guarding. No obvious abdominal masses. GU: vesicular rash along the head of the penis without signs of secondary infection Msk:  Strength and tone normal for age. Extremities/Skin: Warm and dry. No clubbing or cyanosis. No edema. Resolving pinpoint rash along the dorsum of the right foot. No signs of secondary infection. Neuro: Alert and oriented X 3. Moves all extremities spontaneously. Gait is normal. CNII-XII grossly in tact. Psych:  Responds to questions appropriately with a normal affect.   Labs: MMR titers pending   ASSESSMENT AND PLAN:  26 y.o. year old male with HSV flare, poison ivy, and immunization review 1. HSV flare -Valtrex 500 mg 1 po bid #6 RF 2 -Continue daily Valtrex as directed -Abstain from intercourse  2. Poison ivy -Prednisone 20 mg #18 3x3, 2x3, 1x3 no RF  3. Immunization review -Await MMR titer    Signed, Eula Listen, PA-C 09/17/2011 5:49 PM

## 2011-12-16 IMAGING — CT CT HEAD W/O CM
3 series · 16 of 47 positions shown, 19 images · non-contrast
Comparison: None.

CT HEAD

CLINICAL DATA: Fall with injury to the left orbital region and
upper lip.  Pain.

CT HEAD WITHOUT CONTRAST
CT MAXILLOFACIAL WITHOUT CONTRAST
TECHNIQUE: Multidetector CT imaging of the head and maxillofacial
structures were performed using the standard protocol without
intravenous contrast. Multiplanar CT image reconstructions of the
maxillofacial structures were also generated.

[Series 4: headseq 4.8 h45s · axial · 0.43mm/px · z∈[-116,+26]mm · 10 of 36 slices shown, 13 images]
[im 3/36  brain]
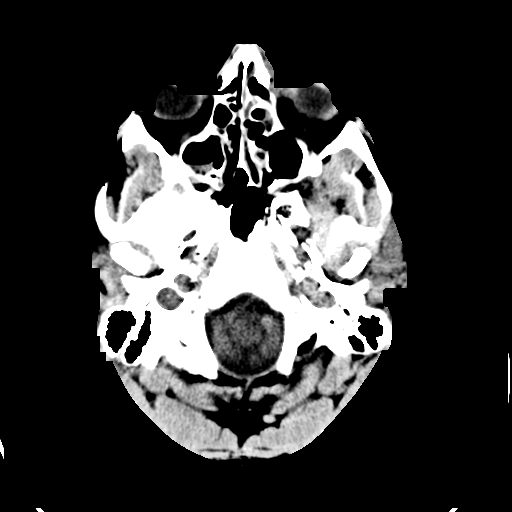
[im 3/36  bone]
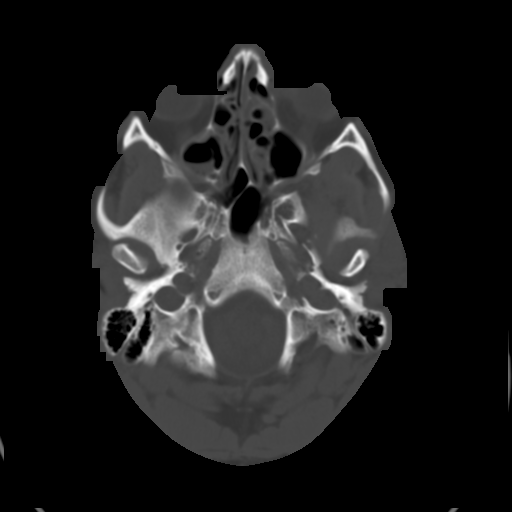
[im 7/36  brain]
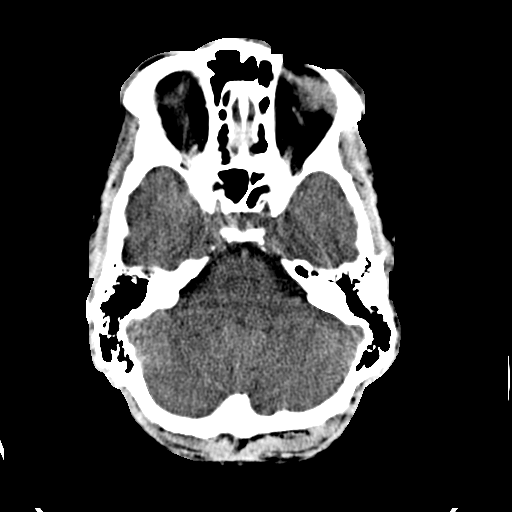
[im 10/36  brain]
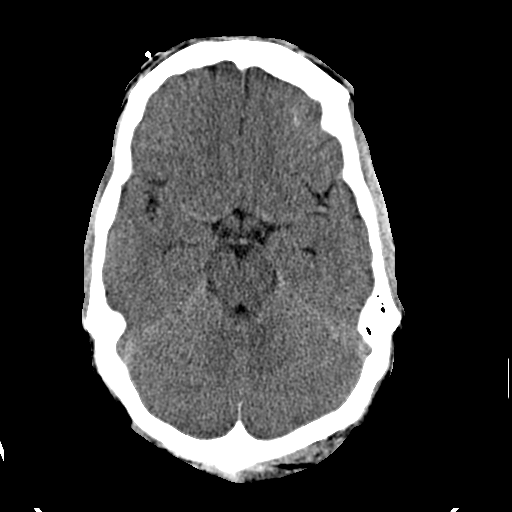
[im 13/36  brain]
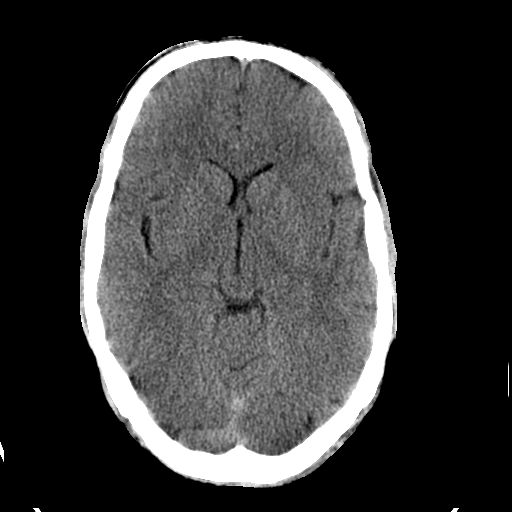
[im 16/36  brain]
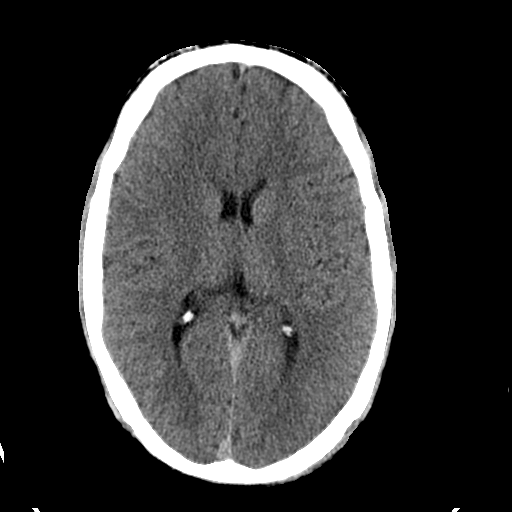
[im 16/36  bone]
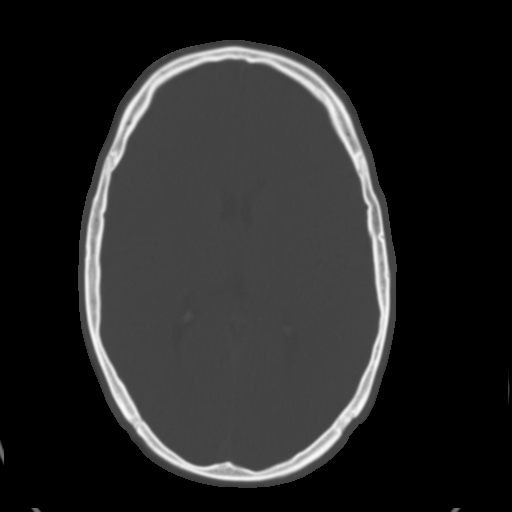
[im 20/36  brain]
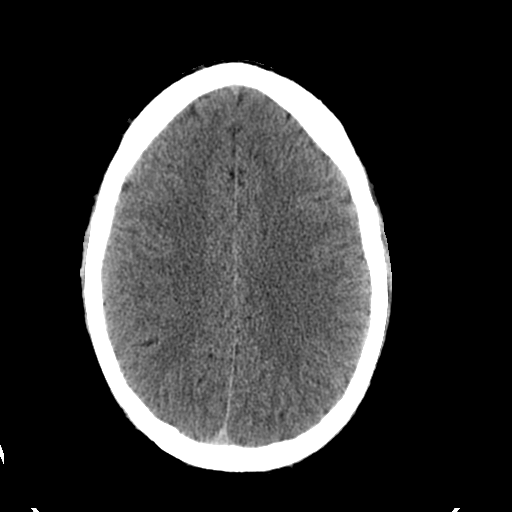
[im 23/36  brain]
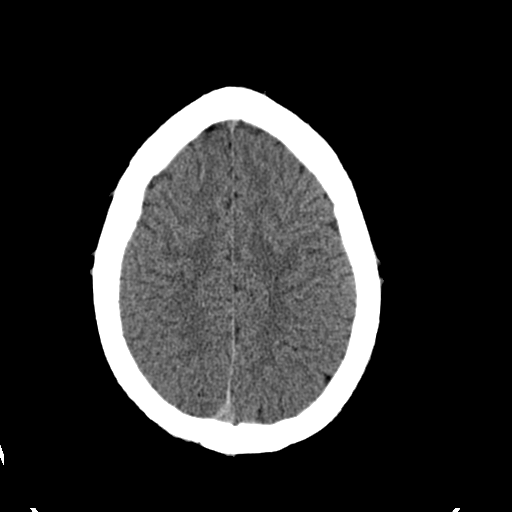
[im 27/36  brain]
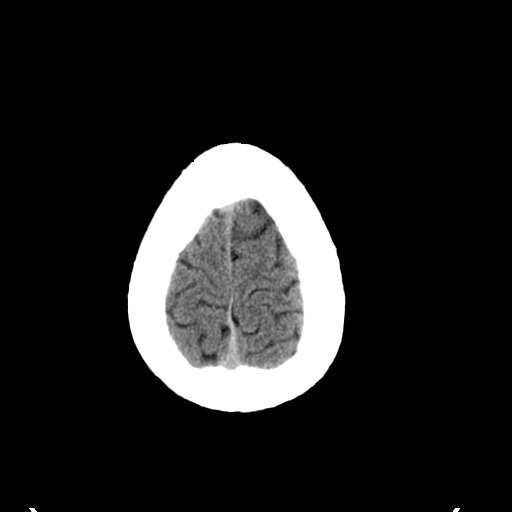
[im 29/36  brain]
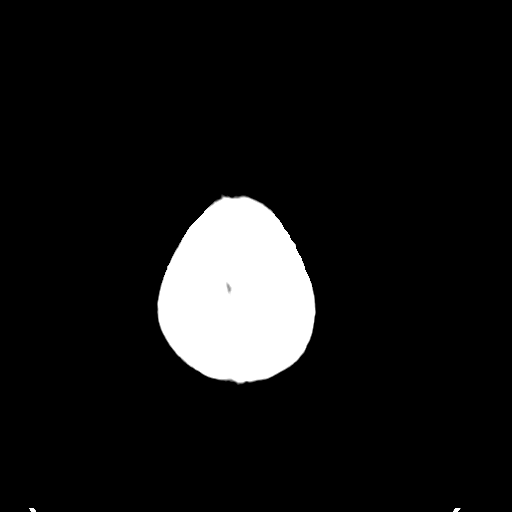
[im 29/36  bone]
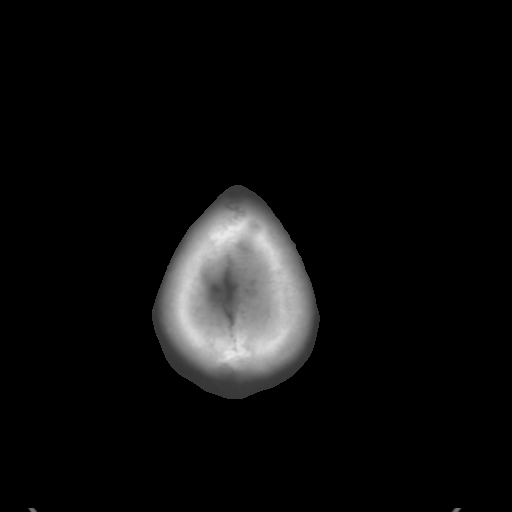
[im 33/36  brain]
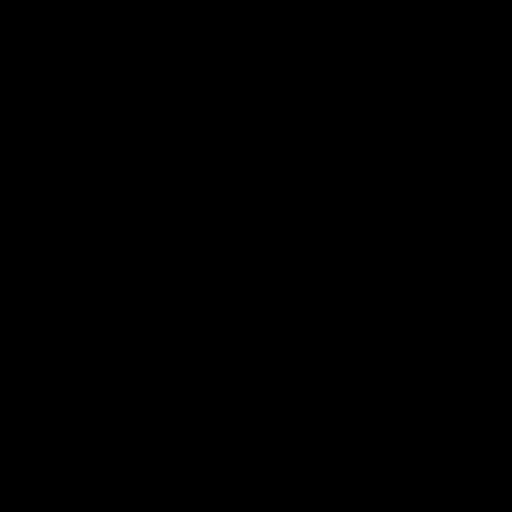

[Series 602: coronal soft tissue · coronal · 0.34mm/px · 3 of 88 slices shown]
[im 30/88  brain]
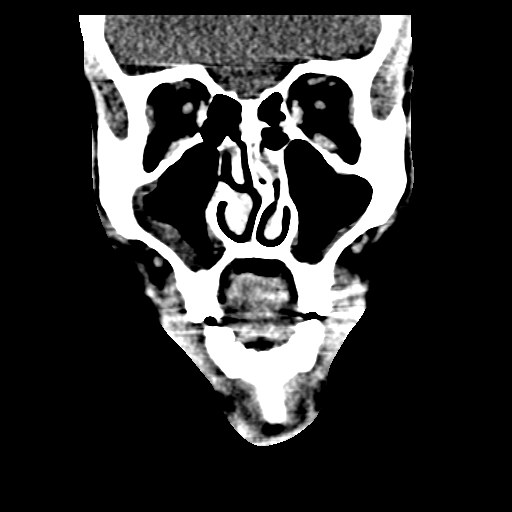
[im 39/88  brain]
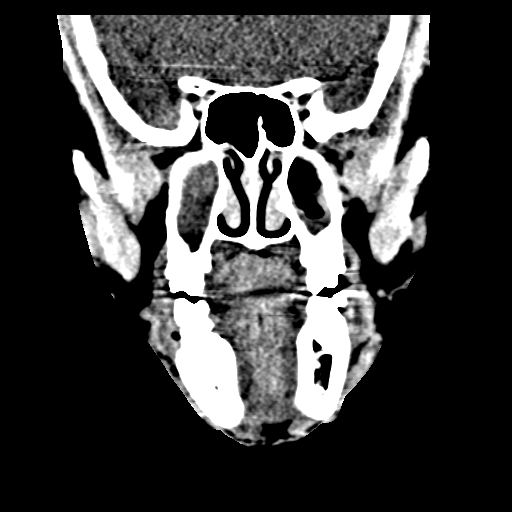
[im 49/88  brain]
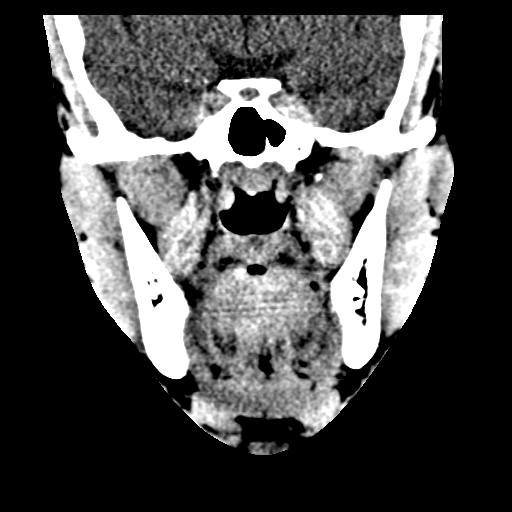

[Series 603: sagittal soft tissue · sagittal · 0.34mm/px · 3 of 76 slices shown]
[im 26/76  brain]
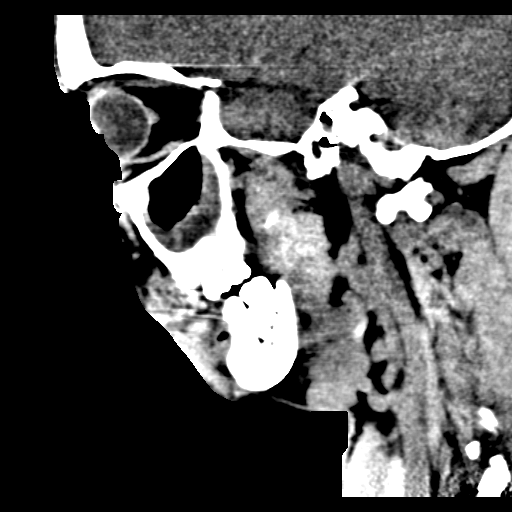
[im 38/76  brain]
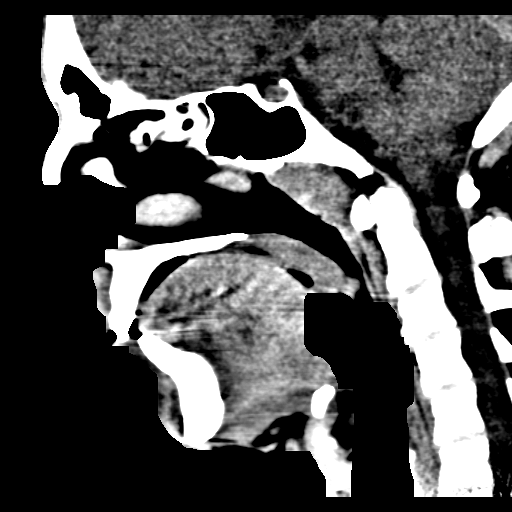
[im 51/76  brain]
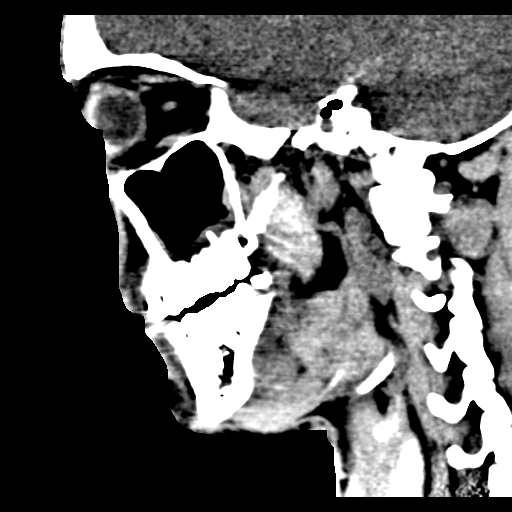

[16 of 47 positions shown; findings below may reference images not displayed]

FINDINGS: There is no evidence for acute hemorrhage, hydrocephalus, mass
lesion, or abnormal extra-axial fluid collection.  No definite CT
evidence for acute infarction.  Air-fluid level is visible in the
right maxillary sinus.  No evidence for skull fracture.
IMPRESSION: No acute intracranial abnormality.

CT MAXILLOFACIAL
FINDINGS: The mandible is intact.  The temporomandibular joints are located.
No evidence for an acute nasal bone fracture.  No evidence for
maxillary sinus fracture.  The zygomatic arches are intact.
Inferior and medial orbital walls are intact.

There is circumferential mucosal thickening in the maxillary
sinuses with a small air-fluid level in the right maxillary sinus.
Sphenoid sinuses, frontal sinuses, and mastoid air cells are clear
bilaterally.
IMPRESSION: No acute facial bone fracture.

Acute on chronic maxillary sinusitis.

## 2012-11-17 IMAGING — CR DG SHOULDER 2+V*L*
2 series · 2 of 2 positions shown · non-contrast
Comparison: None.

CLINICAL DATA: History of recurring injury to the shoulder.
Painful left shoulder.

LEFT SHOULDER - 2+ VIEW

[ap ext rot]
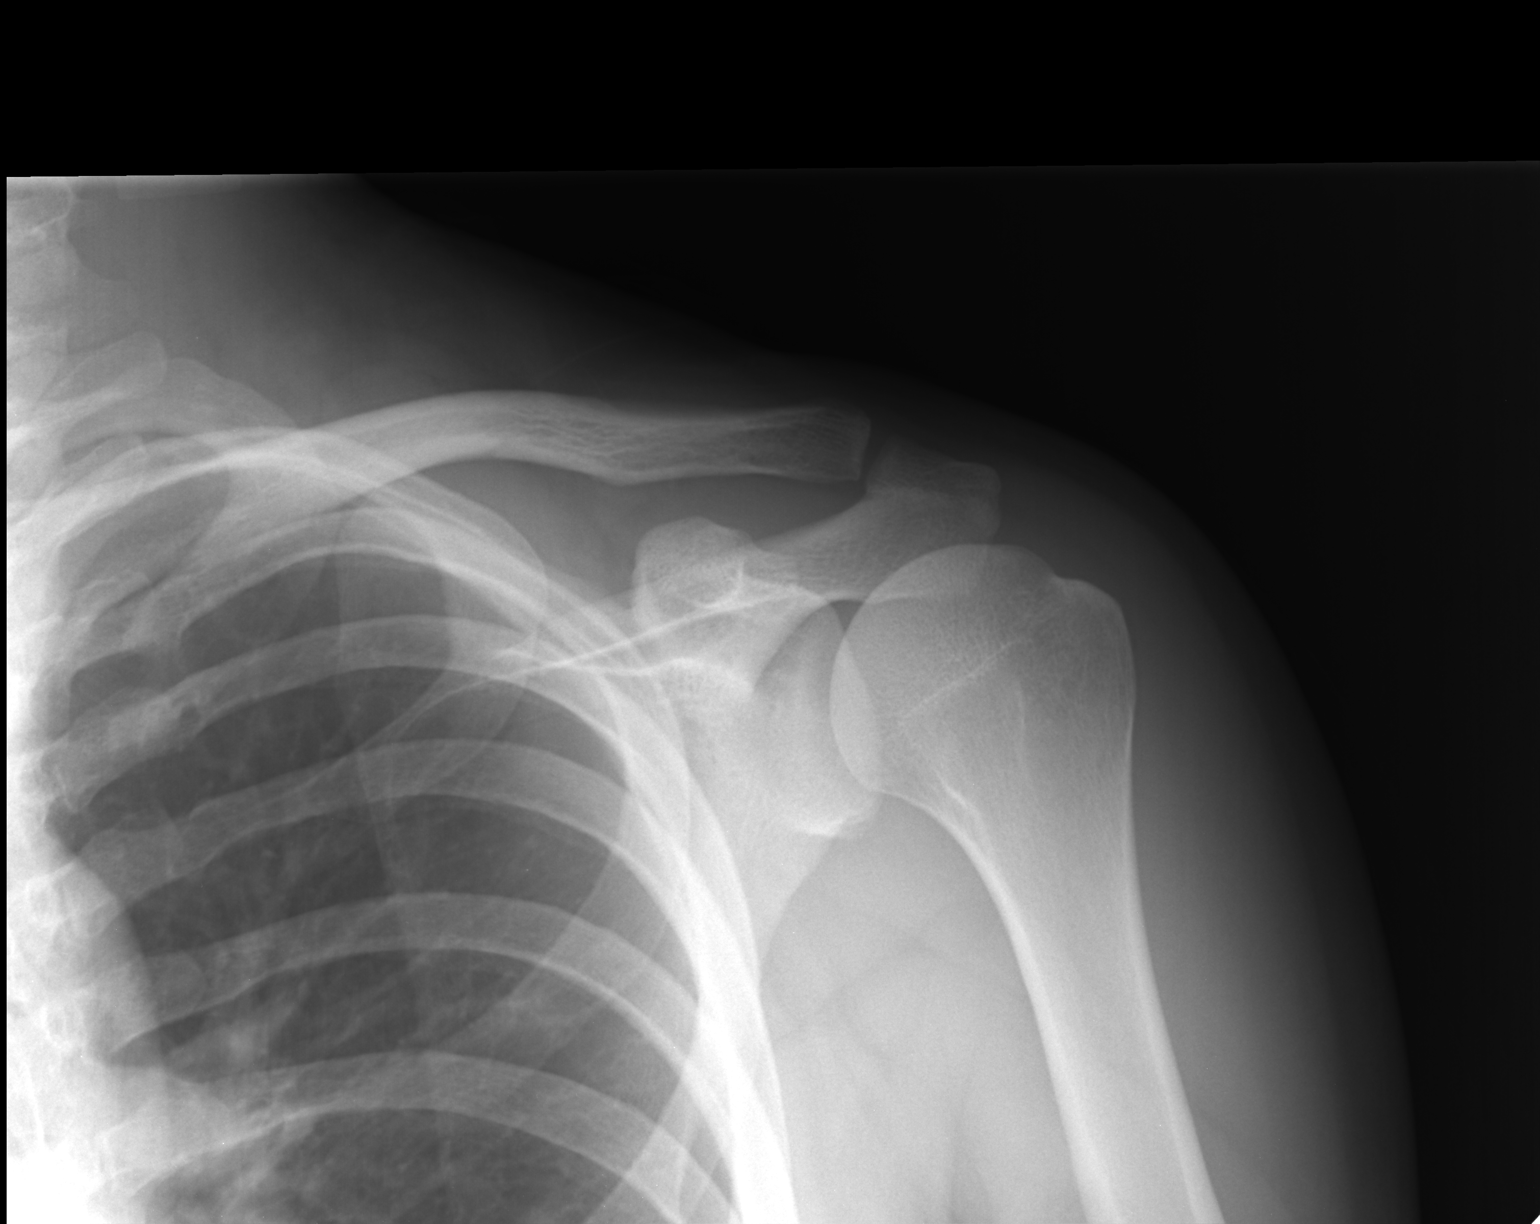

[ap int rot]
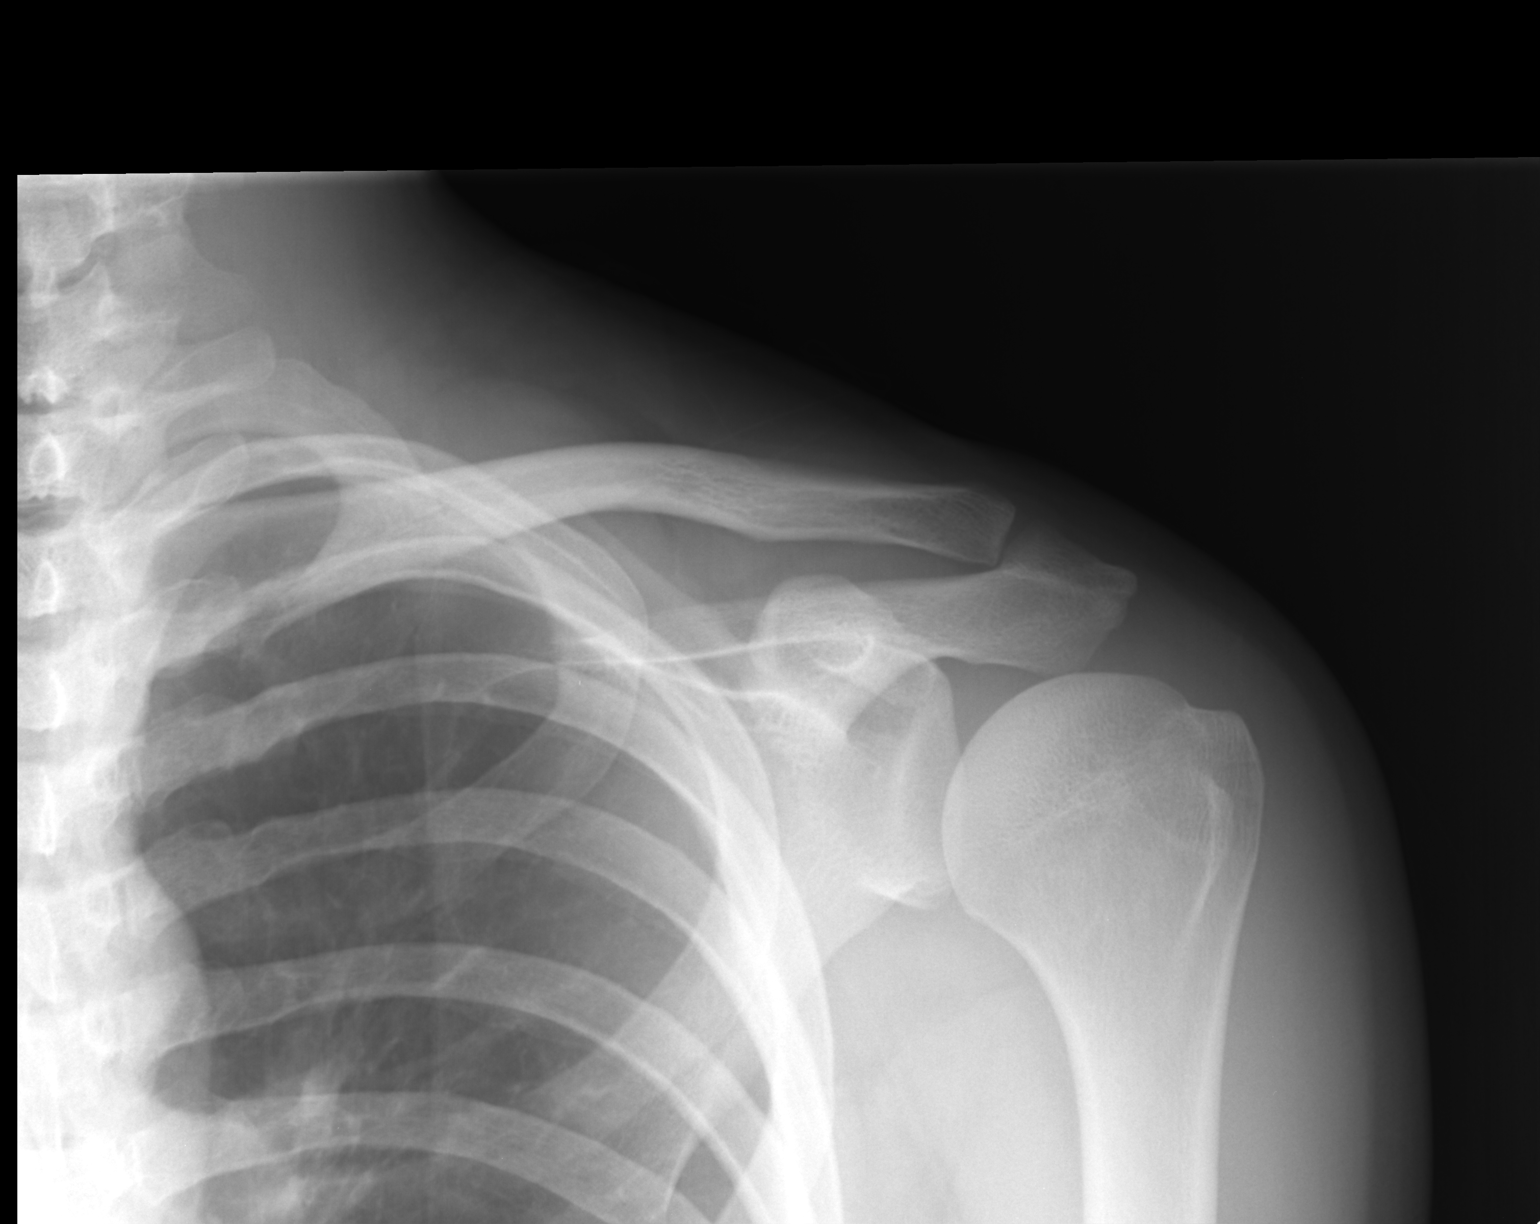

[2 of 2 positions shown; findings below may reference images not displayed]

FINDINGS: Alignment is normal.  Joint spaces are preserved.  No
fracture or dislocation is evident.  No soft tissue lesions are
seen.  No calcific bursitis or calcific tendonitis is evident.  No
cervical rib is evident.
IMPRESSION: Normal appearance of left shoulder.

## 2013-07-24 ENCOUNTER — Ambulatory Visit (INDEPENDENT_AMBULATORY_CARE_PROVIDER_SITE_OTHER): Payer: 59 | Admitting: Physician Assistant

## 2013-07-24 VITALS — BP 140/98 | HR 64 | Temp 97.4°F | Resp 18 | Ht 68.5 in | Wt 178.8 lb

## 2013-07-24 DIAGNOSIS — B009 Herpesviral infection, unspecified: Secondary | ICD-10-CM

## 2013-07-24 DIAGNOSIS — L600 Ingrowing nail: Secondary | ICD-10-CM

## 2013-07-24 MED ORDER — ACYCLOVIR 400 MG PO TABS
400.0000 mg | ORAL_TABLET | Freq: Every day | ORAL | Status: AC
Start: 2013-07-24 — End: ?

## 2013-07-24 MED ORDER — CEPHALEXIN 500 MG PO CAPS: 500.0000 mg | ORAL_CAPSULE | Freq: Three times a day (TID) | ORAL | Status: AC

## 2013-07-24 NOTE — Progress Notes (Signed)
   Subjective:    Patient ID: Jesus Lee, male    DOB: 11/05/1985, 28 y.o.   MRN: [id number]   PCP: Carola FrostUNN,RYAN, PA-C  Chief Complaint  Patient presents with  . Foot Pain    Right foot big toe he feels like he has an in grown toe nail/ redness and swelling  . 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   HPI  1. Thinks that he has an infected ingrown toenail. Never had this before. On his feet all the time.  No specific trauma or injury. Symptoms x 1 month.  Tried to get the ingrowing nail corner out himself, without success. Occasional drainage of pus from the site.  2. 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  Had previously used creams. Is interested in trying acyclovir (Zovirax) for suppressive treatment.  He notes that the outbreak he had was genital, but the IgG testing revealed type 1.  He had several outbreaks initially, but they are infrequent now.  Review of Systems As above.    Objective:   Physical Exam  Blood pressure 140/98, pulse 64, temperature 97.4 F (36.3 C), temperature source Oral, resp. rate 18, height 5' 8.5" (1.74 m), weight 178 lb 12.8 oz (81.103 kg), SpO2 98.00%. Body mass index is 26.79 kg/(m^2). Well-developed, well nourished WM who is awake, alert and oriented, in NAD. HEENT: [place]/AT, sclera and conjunctiva are clear.   Heart: Regular rate Lungs: normal effort Extremities: no cyanosis, clubbing.  RIGHT great toe with erythema and mild edema, granulomatous tissue along the lateral aspect of the distal nail fold and very short nail.  Consistent with ingrowing nail. Skin: warm and dry without rash. Psychologic: good mood and appropriate affect, normal speech and behavior.  PROCEDURE: Verbal consent obtained. Digital  block with 4 cc 2% lidocaine plain.  Additional 3 cc locally required to achieve anesthesia. SP&D.  Lateral nail lifted and removed.  Xeroform placed.  Cleansed and dressed.      Assessment & Plan:  1. Ingrown right greater toenail Anticipatory guidance, toenail care reviewed. - cephALEXin (KEFLEX) 500 MG capsule; Take 1 capsule (500 mg total) by mouth 3 (three) times daily.  Dispense: 21 capsule; Refill: 0  2. HSV-1 (herpes simplex virus 1) infection Advised he may have a similar reaction to the acyclovir.  If occurs, he's to stop the acyclovir and contact us. - acyclovir (ZOVIRAX) 400 MG tablet; Take 1 tablet (400 mg total) by mouth 5 (five) times daily.  Dispense: 180 tablet; Refill: 3   Fernande Brashelle S. Wendee Hata, PA-C Physician Assistant-Certified [facility]

## 2013-07-24 NOTE — Patient Instructions (Signed)
INGROWN TOENAIL . Keep area clean, dry and bandaged for 24 hours. . After 24 hours, remove outer bandage and leave yellow gauze in place. . Soak toe/foot in warm soapy water for 5-10 minutes, once daily for 5 days. Rebandage toe after each cleaning. . Continue soaks until yellow gauze falls off. . Notify the office if you experience any of the following signs of infection: Swelling, redness, pus drainage, streaking, fever > 101.0 F
